# Patient Record
Sex: Male | Born: 1985 | Hispanic: Yes | Marital: Single | Smoking: Never smoker
Health system: Southern US, Community
[De-identification: ages and names within clinical notes are randomized; demographics above are authoritative.]

## PROBLEM LIST (undated history)

## (undated) HISTORY — PX: APPENDECTOMY: SHX54

## (undated) HISTORY — PX: ELBOW FRACTURE SURGERY: SHX616

---

## 2013-06-03 ENCOUNTER — Emergency Department (HOSPITAL_COMMUNITY): Payer: Worker's Compensation

## 2013-06-03 ENCOUNTER — Encounter (HOSPITAL_BASED_OUTPATIENT_CLINIC_OR_DEPARTMENT_OTHER)
Admission: RE | Disposition: A | Discharge status: Home / Self Care | Payer: Self-pay | Source: Ambulatory Visit | Attending: Orthopedic Surgery

## 2013-06-03 ENCOUNTER — Emergency Department (HOSPITAL_COMMUNITY)
Admission: EM | Admit: 2013-06-03 | Discharge: 2013-06-03 | Disposition: A | Discharge status: Home / Self Care | Payer: Worker's Compensation | Source: Home / Self Care | Attending: Emergency Medicine | Admitting: Emergency Medicine

## 2013-06-03 ENCOUNTER — Ambulatory Visit (HOSPITAL_BASED_OUTPATIENT_CLINIC_OR_DEPARTMENT_OTHER): Payer: Worker's Compensation | Admitting: Anesthesiology

## 2013-06-03 ENCOUNTER — Encounter (HOSPITAL_BASED_OUTPATIENT_CLINIC_OR_DEPARTMENT_OTHER): Payer: Self-pay | Admitting: *Deleted

## 2013-06-03 ENCOUNTER — Ambulatory Visit (HOSPITAL_BASED_OUTPATIENT_CLINIC_OR_DEPARTMENT_OTHER)
Admission: RE | Admit: 2013-06-03 | Discharge: 2013-06-03 | Disposition: A | Discharge status: Home / Self Care | Payer: Worker's Compensation | Source: Ambulatory Visit | Attending: Orthopedic Surgery | Admitting: Orthopedic Surgery

## 2013-06-03 ENCOUNTER — Encounter (HOSPITAL_BASED_OUTPATIENT_CLINIC_OR_DEPARTMENT_OTHER): Payer: Worker's Compensation | Admitting: Anesthesiology

## 2013-06-03 ENCOUNTER — Encounter (HOSPITAL_COMMUNITY): Payer: Self-pay | Admitting: Emergency Medicine

## 2013-06-03 DIAGNOSIS — Z9089 Acquired absence of other organs: Secondary | ICD-10-CM | POA: Insufficient documentation

## 2013-06-03 DIAGNOSIS — S62523A Displaced fracture of distal phalanx of unspecified thumb, initial encounter for closed fracture: Secondary | ICD-10-CM

## 2013-06-03 DIAGNOSIS — S62522B Displaced fracture of distal phalanx of left thumb, initial encounter for open fracture: Secondary | ICD-10-CM

## 2013-06-03 DIAGNOSIS — W19XXXA Unspecified fall, initial encounter: Secondary | ICD-10-CM | POA: Insufficient documentation

## 2013-06-03 DIAGNOSIS — S62639B Displaced fracture of distal phalanx of unspecified finger, initial encounter for open fracture: Secondary | ICD-10-CM | POA: Insufficient documentation

## 2013-06-03 HISTORY — PX: I & D EXTREMITY: SHX5045

## 2013-06-03 SURGERY — IRRIGATION AND DEBRIDEMENT EXTREMITY
Anesthesia: General | Site: Thumb | Laterality: Left

## 2013-06-03 MED ORDER — FENTANYL CITRATE 0.05 MG/ML IJ SOLN
INTRAMUSCULAR | Status: AC
  Filled 2013-06-03: qty 6

## 2013-06-03 MED ORDER — TETANUS-DIPHTH-ACELL PERTUSSIS 5-2.5-18.5 LF-MCG/0.5 IM SUSP
0.5000 mL | Freq: Once | INTRAMUSCULAR | Status: AC
  Administered 2013-06-03: 0.5 mL via INTRAMUSCULAR
  Filled 2013-06-03: qty 0.5

## 2013-06-03 MED ORDER — KETOROLAC TROMETHAMINE 30 MG/ML IJ SOLN
INTRAMUSCULAR | Status: DC | PRN
  Administered 2013-06-03: 30 mg via INTRAVENOUS

## 2013-06-03 MED ORDER — MIDAZOLAM HCL 2 MG/2ML IJ SOLN
INTRAMUSCULAR | Status: AC
  Filled 2013-06-03: qty 2

## 2013-06-03 MED ORDER — BUPIVACAINE HCL (PF) 0.5 % IJ SOLN
INTRAMUSCULAR | Status: AC
  Filled 2013-06-03: qty 30

## 2013-06-03 MED ORDER — LIDOCAINE HCL (CARDIAC) 20 MG/ML IV SOLN
INTRAVENOUS | Status: DC | PRN
  Administered 2013-06-03: 60 mg via INTRAVENOUS

## 2013-06-03 MED ORDER — LACTATED RINGERS IV SOLN
INTRAVENOUS | Status: DC
  Administered 2013-06-03: 16:00:00 via INTRAVENOUS

## 2013-06-03 MED ORDER — MORPHINE SULFATE 4 MG/ML IJ SOLN
4.0000 mg | Freq: Once | INTRAMUSCULAR | Status: AC
  Administered 2013-06-03: 4 mg via INTRAMUSCULAR
  Filled 2013-06-03: qty 1

## 2013-06-03 MED ORDER — ONDANSETRON HCL 4 MG/2ML IJ SOLN
INTRAMUSCULAR | Status: DC | PRN
  Administered 2013-06-03: 4 mg via INTRAVENOUS

## 2013-06-03 MED ORDER — CEFAZOLIN SODIUM-DEXTROSE 2-3 GM-% IV SOLR
2.0000 g | Freq: Once | INTRAVENOUS | Status: AC
  Administered 2013-06-03: 2 g via INTRAVENOUS

## 2013-06-03 MED ORDER — FENTANYL CITRATE 0.05 MG/ML IJ SOLN
25.0000 ug | INTRAMUSCULAR | Status: DC | PRN
  Administered 2013-06-03: 50 ug via INTRAVENOUS

## 2013-06-03 MED ORDER — OXYCODONE-ACETAMINOPHEN 5-325 MG PO TABS: 1.0000 | ORAL_TABLET | ORAL | Status: AC | PRN

## 2013-06-03 MED ORDER — DEXAMETHASONE SODIUM PHOSPHATE 4 MG/ML IJ SOLN
INTRAMUSCULAR | Status: DC | PRN
  Administered 2013-06-03: 10 mg via INTRAVENOUS

## 2013-06-03 MED ORDER — MIDAZOLAM HCL 2 MG/2ML IJ SOLN: 1.0000 mg | INTRAMUSCULAR | Status: DC | PRN

## 2013-06-03 MED ORDER — OXYCODONE HCL 5 MG PO TABS
5.0000 mg | ORAL_TABLET | Freq: Once | ORAL | Status: AC | PRN
  Administered 2013-06-03: 5 mg via ORAL
  Filled 2013-06-03: qty 1

## 2013-06-03 MED ORDER — ONDANSETRON HCL 4 MG/2ML IJ SOLN: 4.0000 mg | Freq: Four times a day (QID) | INTRAMUSCULAR | Status: DC | PRN

## 2013-06-03 MED ORDER — BUPIVACAINE HCL (PF) 0.25 % IJ SOLN
INTRAMUSCULAR | Status: AC
Start: 2013-06-03 — End: 2013-06-03
  Filled 2013-06-03: qty 30

## 2013-06-03 MED ORDER — OXYCODONE HCL 5 MG/5ML PO SOLN: 5.0000 mg | Freq: Once | ORAL | Status: AC | PRN

## 2013-06-03 MED ORDER — FENTANYL CITRATE 0.05 MG/ML IJ SOLN
INTRAMUSCULAR | Status: AC
  Filled 2013-06-03: qty 2

## 2013-06-03 MED ORDER — FENTANYL CITRATE 0.05 MG/ML IJ SOLN: 50.0000 ug | INTRAMUSCULAR | Status: DC | PRN

## 2013-06-03 MED ORDER — FENTANYL CITRATE 0.05 MG/ML IJ SOLN
INTRAMUSCULAR | Status: DC | PRN
  Administered 2013-06-03: 100 ug via INTRAVENOUS

## 2013-06-03 MED ORDER — PROPOFOL 10 MG/ML IV BOLUS
INTRAVENOUS | Status: DC | PRN
  Administered 2013-06-03: 300 mg via INTRAVENOUS

## 2013-06-03 MED ORDER — CEFAZOLIN SODIUM-DEXTROSE 2-3 GM-% IV SOLR
INTRAVENOUS | Status: AC
  Filled 2013-06-03: qty 50

## 2013-06-03 MED ORDER — MIDAZOLAM HCL 5 MG/5ML IJ SOLN
INTRAMUSCULAR | Status: DC | PRN
  Administered 2013-06-03: 2 mg via INTRAVENOUS

## 2013-06-03 MED ORDER — BUPIVACAINE HCL (PF) 0.25 % IJ SOLN
INTRAMUSCULAR | Status: DC | PRN
  Administered 2013-06-03: 7 mL

## 2013-06-03 MED ORDER — LIDOCAINE HCL (PF) 1 % IJ SOLN
INTRAMUSCULAR | Status: AC
  Filled 2013-06-03: qty 30

## 2013-06-03 SURGICAL SUPPLY — 69 items
APL SKNCLS STERI-STRIP NONHPOA (GAUZE/BANDAGES/DRESSINGS)
BAG DECANTER FOR FLEXI CONT (MISCELLANEOUS) IMPLANT
BANDAGE ELASTIC 3 VELCRO ST LF (GAUZE/BANDAGES/DRESSINGS) IMPLANT
BANDAGE ELASTIC 4 VELCRO ST LF (GAUZE/BANDAGES/DRESSINGS) IMPLANT
BENZOIN TINCTURE PRP APPL 2/3 (GAUZE/BANDAGES/DRESSINGS) IMPLANT
BLADE SURG 15 STRL LF DISP TIS (BLADE) ×1 IMPLANT
BLADE SURG 15 STRL SS (BLADE) ×3
BNDG CMPR 9X4 STRL LF SNTH (GAUZE/BANDAGES/DRESSINGS)
BNDG COHESIVE 1X5 TAN STRL LF (GAUZE/BANDAGES/DRESSINGS) ×3 IMPLANT
BNDG ESMARK 4X9 LF (GAUZE/BANDAGES/DRESSINGS) IMPLANT
BNDG GAUZE ELAST 4 BULKY (GAUZE/BANDAGES/DRESSINGS) IMPLANT
CLOSURE WOUND 1/2 X4 (GAUZE/BANDAGES/DRESSINGS)
CORDS BIPOLAR (ELECTRODE) ×3 IMPLANT
COVER TABLE BACK 60X90 (DRAPES) ×3 IMPLANT
CUFF TOURNIQUET SINGLE 18IN (TOURNIQUET CUFF) ×3 IMPLANT
DECANTER SPIKE VIAL GLASS SM (MISCELLANEOUS) IMPLANT
DRAPE EXTREMITY T 121X128X90 (DRAPE) ×3 IMPLANT
DRAPE SURG 17X23 STRL (DRAPES) ×3 IMPLANT
DURAPREP 26ML APPLICATOR (WOUND CARE) ×3 IMPLANT
GAUZE PACKING IODOFORM 1/4X15 (GAUZE/BANDAGES/DRESSINGS) IMPLANT
GAUZE XEROFORM 1X8 LF (GAUZE/BANDAGES/DRESSINGS) IMPLANT
GLOVE BIOGEL M STRL SZ7.5 (GLOVE) ×3 IMPLANT
GLOVE BIOGEL PI IND STRL 8 (GLOVE) ×1 IMPLANT
GLOVE BIOGEL PI INDICATOR 8 (GLOVE) ×2
GLOVE SURG SYN 8.0 (GLOVE) ×6 IMPLANT
GOWN STRL REUS W/ TWL LRG LVL3 (GOWN DISPOSABLE) ×1 IMPLANT
GOWN STRL REUS W/TWL LRG LVL3 (GOWN DISPOSABLE) ×3
GOWN STRL REUS W/TWL XL LVL3 (GOWN DISPOSABLE) ×3 IMPLANT
HANDPIECE INTERPULSE COAX TIP (DISPOSABLE)
IV NS IRRIG 3000ML ARTHROMATIC (IV SOLUTION) IMPLANT
K-WIRE .035X4 (WIRE) ×3 IMPLANT
LOOP VESSEL MAXI BLUE (MISCELLANEOUS) IMPLANT
NEEDLE 27GAX1X1/2 (NEEDLE) IMPLANT
NEEDLE HYPO 25X1 1.5 SAFETY (NEEDLE) ×3 IMPLANT
NS IRRIG 1000ML POUR BTL (IV SOLUTION) IMPLANT
PACK BASIN DAY SURGERY FS (CUSTOM PROCEDURE TRAY) ×3 IMPLANT
PAD CAST 3X4 CTTN HI CHSV (CAST SUPPLIES) IMPLANT
PADDING CAST ABS 3INX4YD NS (CAST SUPPLIES)
PADDING CAST ABS 4INX4YD NS (CAST SUPPLIES)
PADDING CAST ABS COTTON 3X4 (CAST SUPPLIES) IMPLANT
PADDING CAST ABS COTTON 4X4 ST (CAST SUPPLIES) IMPLANT
PADDING CAST COTTON 3X4 STRL (CAST SUPPLIES)
SET HNDPC FAN SPRY TIP SCT (DISPOSABLE) IMPLANT
SHEET MEDIUM DRAPE 40X70 STRL (DRAPES) ×3 IMPLANT
SPLINT FINGER 5/8X2.25 (CAST SUPPLIES) ×1 IMPLANT
SPLINT PLASTALUME 2 1/4 (CAST SUPPLIES) ×3
SPLINT PLASTER CAST XFAST 3X15 (CAST SUPPLIES) IMPLANT
SPLINT PLASTER CAST XFAST 4X15 (CAST SUPPLIES) IMPLANT
SPLINT PLASTER XTRA FAST SET 4 (CAST SUPPLIES)
SPLINT PLASTER XTRA FASTSET 3X (CAST SUPPLIES)
SPONGE GAUZE 4X4 12PLY (GAUZE/BANDAGES/DRESSINGS) ×3 IMPLANT
STOCKINETTE 4X48 STRL (DRAPES) ×3 IMPLANT
STRIP CLOSURE SKIN 1/2X4 (GAUZE/BANDAGES/DRESSINGS) IMPLANT
SUT ETHILON 3 0 PS 1 (SUTURE) IMPLANT
SUT ETHILON 5 0 PS 2 18 (SUTURE) IMPLANT
SUT PROLENE 3 0 PS 2 (SUTURE) IMPLANT
SUT VIC AB 4-0 P-3 18XBRD (SUTURE) IMPLANT
SUT VIC AB 4-0 P3 18 (SUTURE)
SUT VICRYL RAPIDE 4/0 PS 2 (SUTURE) IMPLANT
SWAB COLLECTION DEVICE MRSA (MISCELLANEOUS) IMPLANT
SYR BULB 3OZ (MISCELLANEOUS) ×3 IMPLANT
SYR CONTROL 10ML LL (SYRINGE) ×3 IMPLANT
SYRINGE 10CC LL (SYRINGE) ×3 IMPLANT
TOWEL OR 17X24 6PK STRL BLUE (TOWEL DISPOSABLE) ×3 IMPLANT
TUBE ANAEROBIC SPECIMEN COL (MISCELLANEOUS) IMPLANT
TUBE CONNECTING 20'X1/4 (TUBING)
TUBE CONNECTING 20X1/4 (TUBING) IMPLANT
UNDERPAD 30X30 INCONTINENT (UNDERPADS AND DIAPERS) ×3 IMPLANT
YANKAUER SUCT BULB TIP NO VENT (SUCTIONS) IMPLANT

## 2013-06-03 NOTE — ED Provider Notes (Signed)
CSN: [id number]     Arrival date & time 06/03/13  0941 History   First MD Initiated Contact with Patient 06/03/13 1000     Chief Complaint  Patient presents with  . Laceration   (Consider location/radiation/quality/duration/timing/severity/associated sxs/prior Treatment) The history is provided by the patient and medical records.   This is a 28 y.o. M with no significant PMH presenting to the ED for thumb laceration.  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  While trying to catch himself, he cut his left thumb on the shears.  Pt has deep laceration to distal left thumb, bleeding well controlled on arrival.  No head trauma, LOC, or other injuries reported.  Pt is right hand dominant.  Date of last tetanus unknown.  Last PO intake 0700.  History reviewed. No pertinent past medical history. Past Surgical History  Procedure Laterality Date  . Elbow fracture surgery Right   . Appendectomy     No family history on file. History  Substance Use Topics  . Smoking status: Never Smoker   . Smokeless tobacco: Not on file  . Alcohol Use: Yes     Comment: social    Review of Systems  Skin: Positive for wound.  All other systems reviewed and are negative.    Allergies  Review of patient's allergies indicates no known allergies.  Home Medications  No current outpatient prescriptions on file. BP 125/87  Pulse 84  Temp(Src) 97.6 F (36.4 C) (Oral)  Resp 18  SpO2 99%  Physical Exam  Nursing note and vitals reviewed. Constitutional: He is oriented to person, place, and time. He appears well-developed and well-nourished. No distress.  HENT:  Head: Normocephalic and atraumatic.  Mouth/Throat: Oropharynx is clear and moist.  Eyes: Conjunctivae and EOM are normal. Pupils are equal, round, and reactive to light.  Neck: Normal range of motion. Neck supple.  Cardiovascular: Normal rate, regular rhythm and normal heart sounds.     Pulmonary/Chest: Effort normal and breath sounds normal. No respiratory distress. He has no wheezes.  Musculoskeletal: Normal range of motion.       Hands: Deep laceration to left thumb just distal to IP joint spanning across volar and dorsal surface, somewhat macerated in appearance; full flexion and extension of thumb maintained; sensation intact diffusely throughout digit; normal cap refill  Neurological: He is alert and oriented to person, place, and time.  Skin: Skin is warm and dry. He is not diaphoretic.  Psychiatric: He has a normal mood and affect.       ED Course  Procedures (including critical care time) Labs Review Labs Reviewed - No data to display Imaging Review Dg Finger Thumb Left  06/03/2013   CLINICAL DATA:  Status post fall.  Laceration left thumb.  EXAM: LEFT THUMB 2+V  COMPARISON:  None.  FINDINGS: There is a comminuted fracture of the distal phalanx of the thumb with an associated laceration. The fracture is mildly distracted just proximal to the tuft but not notably displaced. No radiopaque foreign body is identified.  IMPRESSION: Comminuted, open fracture distal phalanx left thumb.   Electronically Signed   By: Drusilla Kanner M.D.   On: 06/03/2013 10:56    EKG Interpretation   None       MDM   1. Open fracture of distal phalanx of left thumb    Comminuted, open fx of left distal thumb.  Tetanus updated.  Consulted hand surgery, Dr. Mina Marble-- [facility] to  [facility] for formal repair in OR.  Pt will go to [facility] by POV-- instructed to remain NPO.  Discussed plan with pt and family in room, both acknowledged understanding and agreed.  Garlon HatchetLisa M Ayslin Kundert, PA-C 06/03/13 [id number]

## 2013-06-03 NOTE — H&P (Signed)
Patrick Conley is an 28 y.o. male.   Chief Complaint: left thumb pain HPI: as above s/p complex laceration to left thumb at work  No past medical history on file.  Past Surgical History  Procedure Laterality Date  . Elbow fracture surgery Right   . Appendectomy      History reviewed. No pertinent family history. Social History:  reports that he has never smoked. He does not have any smokeless tobacco history on file. He reports that he drinks alcohol. He reports that he does not use illicit drugs.  Allergies: No Known Allergies  No prescriptions prior to admission    No results found for this or any previous visit (from the past 48 hour(s)). Dg Finger Thumb Left  06/03/2013   CLINICAL DATA:  Status post fall.  Laceration left thumb.  EXAM: LEFT THUMB 2+V  COMPARISON:  None.  FINDINGS: There is a comminuted fracture of the distal phalanx of the thumb with an associated laceration. The fracture is mildly distracted just proximal to the tuft but not notably displaced. No radiopaque foreign body is identified.  IMPRESSION: Comminuted, open fracture distal phalanx left thumb.   Electronically Signed   By: Drusilla Kannerhomas  Dalessio M.D.   On: 06/03/2013 10:56    Review of Systems  All other systems reviewed and are negative.    Blood pressure 118/56, pulse 72, temperature 98.2 F (36.8 C), temperature source Oral, resp. rate 16, height 5\' 10"  (1.778 m), weight 90.719 kg (200 lb), SpO2 98.00%. Physical Exam  Constitutional: He is oriented to person, place, and time. He appears well-developed and well-nourished.  HENT:  Head: Normocephalic and atraumatic.  Cardiovascular: Normal rate.   Respiratory: Effort normal.  Musculoskeletal:       Left hand: He exhibits disruption of two-point discrimination, deformity and laceration.  Complex left thumb distal laceration with distal phalanx fracture  Neurological: He is alert and oriented to person, place, and time.  Skin: Skin is warm.  Psychiatric:  He has a normal mood and affect. His behavior is normal. Judgment and thought content normal.     Assessment/Plan As above   Plan explore and repair as needed  Noralee Dutko A 06/03/2013, 3:30 PM

## 2013-06-03 NOTE — Anesthesia Procedure Notes (Signed)
Procedure Name: LMA Insertion Performed by: York GricePEARSON, Joellyn Grandt W Pre-anesthesia Checklist: Patient identified, Timeout performed, Emergency Drugs available, Suction available and Patient being monitored Patient Re-evaluated:Patient Re-evaluated prior to inductionOxygen Delivery Method: Circle system utilized Preoxygenation: Pre-oxygenation with 100% oxygen Intubation Type: IV induction Ventilation: Mask ventilation without difficulty LMA: LMA inserted LMA Size: 4.0 Number of attempts: 1 Placement Confirmation: breath sounds checked- equal and bilateral and positive ETCO2 Dental Injury: Teeth and Oropharynx as per pre-operative assessment

## 2013-06-03 NOTE — ED Notes (Addendum)
Pt was holding pruning shears while landscaping this am, fell cutting his L thumb on shears. Pt has deep, lac to L thumb, bleeding controlled. Pt reports no feeling from lac to end of thumb. Pt has good color in fingernail.  Pt denies other injury from fall. Pt is A&O and in NAD

## 2013-06-03 NOTE — Discharge Instructions (Signed)

## 2013-06-03 NOTE — Transfer of Care (Signed)
Immediate Anesthesia Transfer of Care Note  Patient: Patrick Conley  Procedure(s) Performed: Procedure(s): IRRIGATION AND DEBRIDEMENT LEFT THUMB WITH PINNING (Left)  Patient Location: PACU  Anesthesia Type:General  Level of Consciousness: awake and patient cooperative  Airway & Oxygen Therapy: Patient Spontanous Breathing and Patient connected to face mask oxygen  Post-op Assessment: Report given to PACU RN and Post -op Vital signs reviewed and stable  Post vital signs: Reviewed and stable  Complications: No apparent anesthesia complications

## 2013-06-03 NOTE — Discharge Instructions (Signed)
Go directly to [facility]--- go to front desk and they will direct you. DO NOT EAT OR DRINK ANYTHING PRIOR TO ARRIVAL IN THE HOSPITAL! Call [facility] if you have any trouble on your way there-- [phone]

## 2013-06-03 NOTE — Anesthesia Postprocedure Evaluation (Signed)
  Anesthesia Post-op Note  Patient: Patrick Conley  Procedure(s) Performed: Procedure(s): IRRIGATION AND DEBRIDEMENT LEFT THUMB WITH PINNING (Left)  Patient Location: PACU  Anesthesia Type:General  Level of Consciousness: awake, alert  and oriented  Airway and Oxygen Therapy: Patient Spontanous Breathing  Post-op Pain: moderate  Post-op Assessment: Post-op Vital signs reviewed  Post-op Vital Signs: Reviewed  Complications: No apparent anesthesia complications

## 2013-06-03 NOTE — Op Note (Signed)
See note [phone]

## 2013-06-03 NOTE — Anesthesia Preprocedure Evaluation (Addendum)
Anesthesia Evaluation  Patient identified by MRN, date of birth, ID band Patient awake    Reviewed: Allergy & Precautions, H&P , NPO status , Patient's Chart, lab work & pertinent test results  Airway Mallampati: II  Neck ROM: full    Dental   Pulmonary neg pulmonary ROS,          Cardiovascular negative cardio ROS      Neuro/Psych    GI/Hepatic   Endo/Other    Renal/GU      Musculoskeletal   Abdominal   Peds  Hematology   Anesthesia Other Findings   Reproductive/Obstetrics                           Anesthesia Physical Anesthesia Plan  ASA: I  Anesthesia Plan: General   Post-op Pain Management:    Induction: Intravenous  Airway Management Planned: LMA  Additional Equipment:   Intra-op Plan:   Post-operative Plan: Extubation in OR  Informed Consent: I have reviewed the patients History and Physical, chart, labs and discussed the procedure including the risks, benefits and alternatives for the proposed anesthesia with the patient or authorized representative who has indicated his/her understanding and acceptance.     Plan Discussed with: CRNA, Anesthesiologist and Surgeon  Anesthesia Plan Comments:        Anesthesia Quick Evaluation

## 2013-06-04 NOTE — Op Note (Signed)
NAMJeanella Conley:  Patrick Conley, Patrick Conley                ACCOUNT NO.:  [id number]  MEDICAL RECORD NO.:  [id number]  LOCATION:  [facility]OT[facility]                         [facility]  PHYSICIAN:  Artist PaisMatthew A. Darin Redmann, M.D.DATE OF BIRTH:  [birth year]  DATE OF PROCEDURE:  06/03/2013 DATE OF DISCHARGE:  06/03/2013                              OPERATIVE REPORT   PREOPERATIVE DIAGNOSIS:  Open grade 2 distal phalangeal fracture, left thumb.  POSTOPERATIVE DIAGNOSIS:  Open grade 2 distal phalangeal fracture, left thumb.  PROCEDURE:  Incision and drainage above irrigation, incision and drainage with open reduction and internal fixation __________ K-wires.  SURGEON:  Artist PaisMatthew A. Mina MarbleWeingold, M.D.  ASSISTANT:  None.  ANESTHESIA:  General.  COMPLICATION:  None.  DRAINS:  None.  DESCRIPTION OF THE PROCEDURE:  The patient was taken to the operating suite.  After induction of adequate general anesthesia, the left upper extremity was prepped and draped in sterile fashion.  Esmarch was used to exsanguinate the limb.  Tourniquet was inflated to 250 mmHg.  At this point in time, the left thumb was approached.  There was a complex laceration along the radial border from the IP joint distally.  FPL was intact.  There was an open fracture at the midshaft of the distal phalanx.  There was segmental injury in at least 3 places to the neurovascular bundle, which was irreparable.  The wound was thoroughly irrigated.  Clot was removed.  Reduction was performed with close traction and pressure.  Under direct fluoroscopic guidance, __________ K- wires were driven from distal to proximal across the fracture site, with care not to pass across the interphalangeal joint.  Two of these were used to cut outside __________ themselves.  The wound was then closed with complex 4-0 Vicryl Rapide and multiple sutures.  Xeroform, 4 x 4s, fluffs, and a volar splint was applied with a Coban wrap.  The patient tolerated the procedure well  __________.     Artist PaisMatthew A. Mina MarbleWeingold, M.D.    MAW/MEDQ  D:  06/03/2013  T:  06/04/2013  Job:  [id number]

## 2013-06-04 NOTE — Op Note (Deleted)
NAME:  Patrick Conley, Patrick Conley                ACCOUNT NO.:  [id number]  MEDICAL RECORD NO.:  [id number]  LOCATION:  WOTF                         FACILITY:  [facility]  PHYSICIAN:  Tawni Melkonian A. Mikle Sternberg, M.D.DATE OF BIRTH:  03/24/1986  DATE OF PROCEDURE:  06/03/2013 DATE OF DISCHARGE:  06/03/2013                              OPERATIVE REPORT   PREOPERATIVE DIAGNOSIS:  Open grade 2 distal phalangeal fracture, left thumb.  POSTOPERATIVE DIAGNOSIS:  Open grade 2 distal phalangeal fracture, left thumb.  PROCEDURE:  Incision and drainage above irrigation, incision and drainage with open reduction and internal fixation __________ K-wires.  SURGEON:  Ricco Dershem A. Urania Pearlman, M.D.  ASSISTANT:  None.  ANESTHESIA:  General.  COMPLICATION:  None.  DRAINS:  None.  DESCRIPTION OF THE PROCEDURE:  The patient was taken to the operating suite.  After induction of adequate general anesthesia, the left upper extremity was prepped and draped in sterile fashion.  Esmarch was used to exsanguinate the limb.  Tourniquet was inflated to 250 mmHg.  At this point in time, the left thumb was approached.  There was a complex laceration along the radial border from the IP joint distally.  FPL was intact.  There was an open fracture at the midshaft of the distal phalanx.  There was segmental injury in at least 3 places to the neurovascular bundle, which was irreparable.  The wound was thoroughly irrigated.  Clot was removed.  Reduction was performed with close traction and pressure.  Under direct fluoroscopic guidance, __________ K- wires were driven from distal to proximal across the fracture site, with care not to pass across the interphalangeal joint.  Two of these were used to cut outside __________ themselves.  The wound was then closed with complex 4-0 Vicryl Rapide and multiple sutures.  Xeroform, 4 x 4s, fluffs, and a volar splint was applied with a Coban wrap.  The patient tolerated the procedure well  __________.     Athenia Rys A. Katrenia Alkins, M.D.    MAW/MEDQ  D:  06/03/2013  T:  06/04/2013  Job:  [id number] 

## 2013-06-05 NOTE — ED Provider Notes (Signed)
Medical screening examination/treatment/procedure(s) were conducted as a shared visit with non-physician practitioner(s) and myself.  I personally evaluated the patient during the encounter.  Pt with complicated lacerated thumb injury with open fracture.  Hand surgery consulted.  Pt will be evaluated by Dr Mina MarbleWeingold.  Patrick KrasJon R Marielouise Amey, MD 06/05/13 [phone]

## 2013-06-06 ENCOUNTER — Encounter (HOSPITAL_BASED_OUTPATIENT_CLINIC_OR_DEPARTMENT_OTHER): Payer: Self-pay | Admitting: Orthopedic Surgery

## 2013-06-06 LAB — POCT HEMOGLOBIN-HEMACUE: Hemoglobin: 17 g/dL (ref 13.0–17.0)

## 2015-05-12 IMAGING — CR DG FINGER THUMB 2+V*L*
3 series · 3 of 3 positions shown · non-contrast
Comparison: None.

CLINICAL DATA: Status post fall.  Laceration left thumb.

EXAM:
LEFT THUMB 2+V

[x finger pa left]
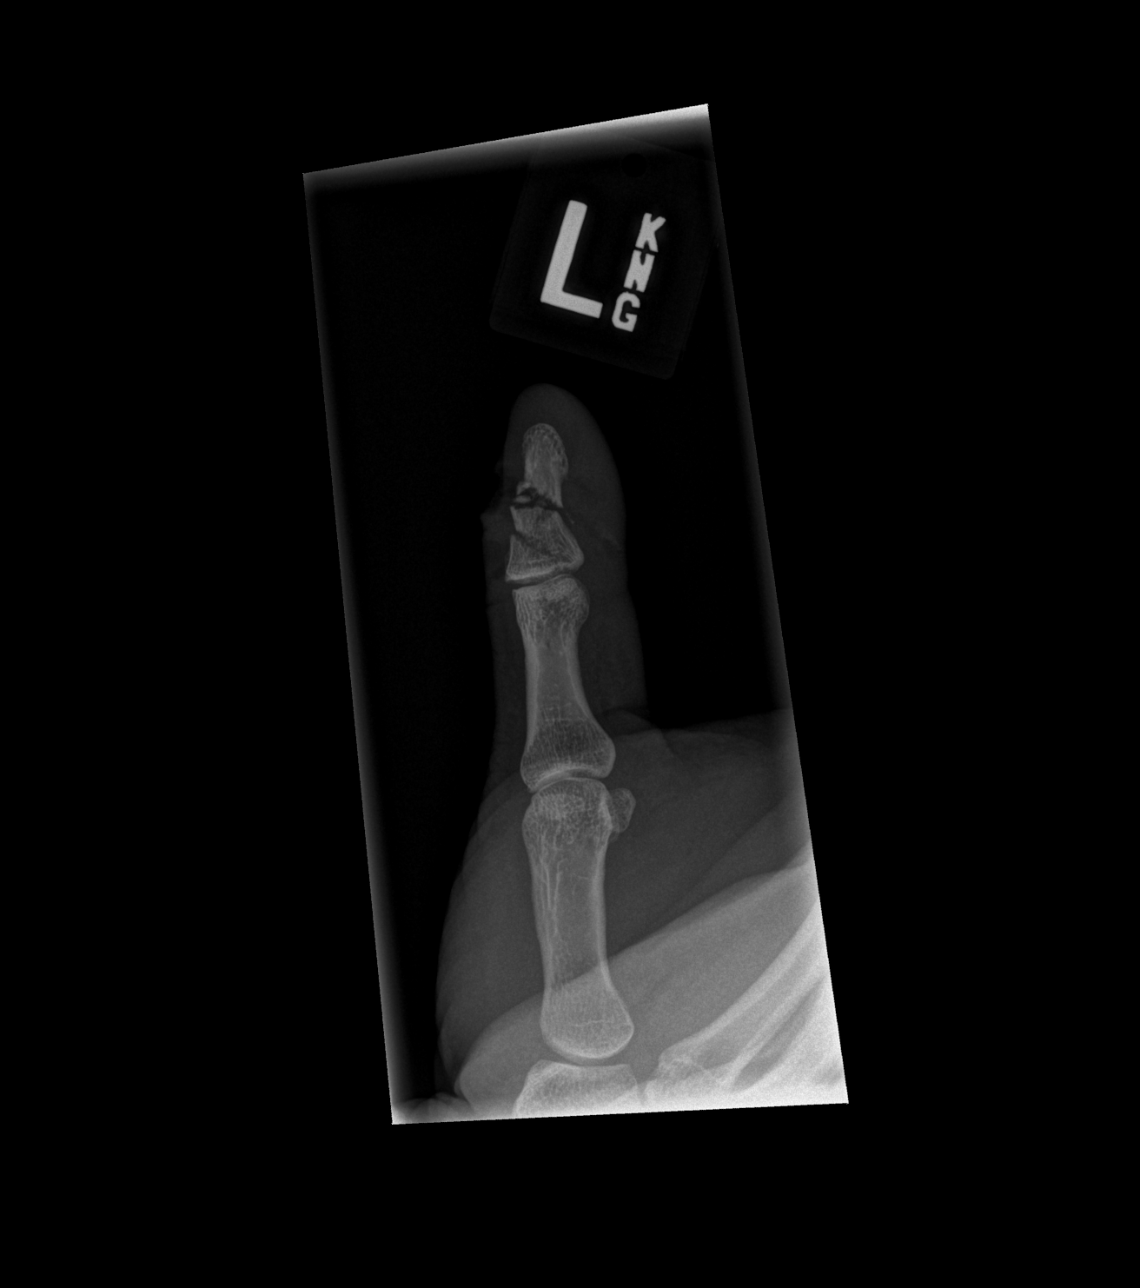

[x finger obl left]
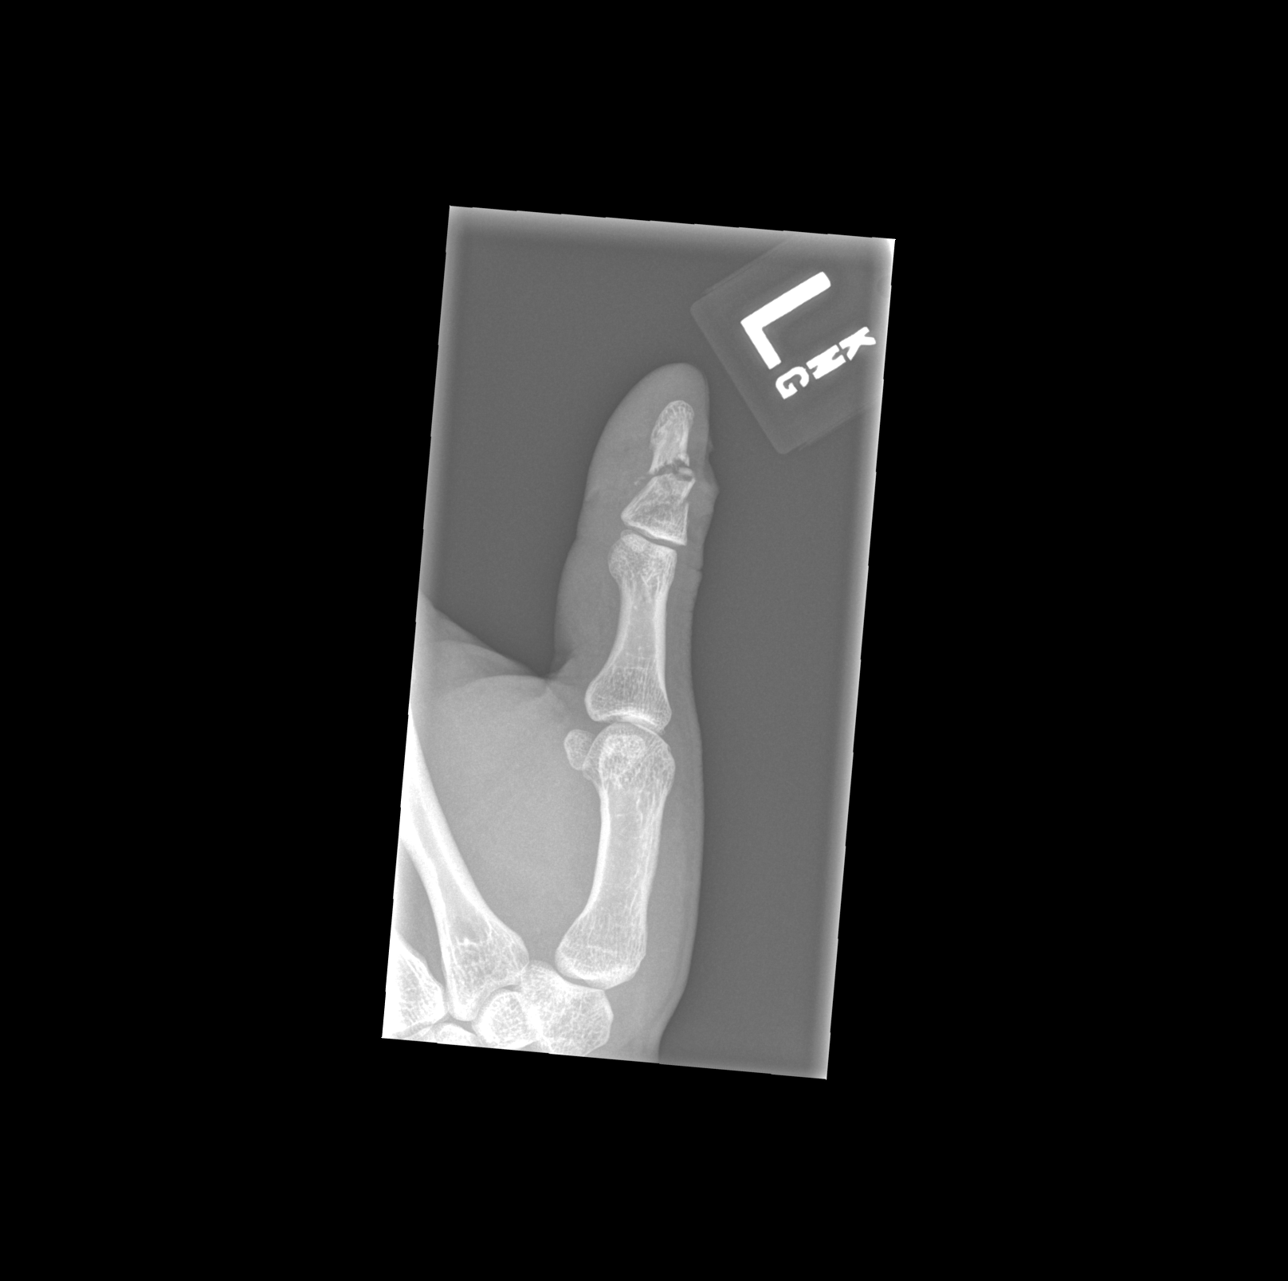

[x finger lat left]
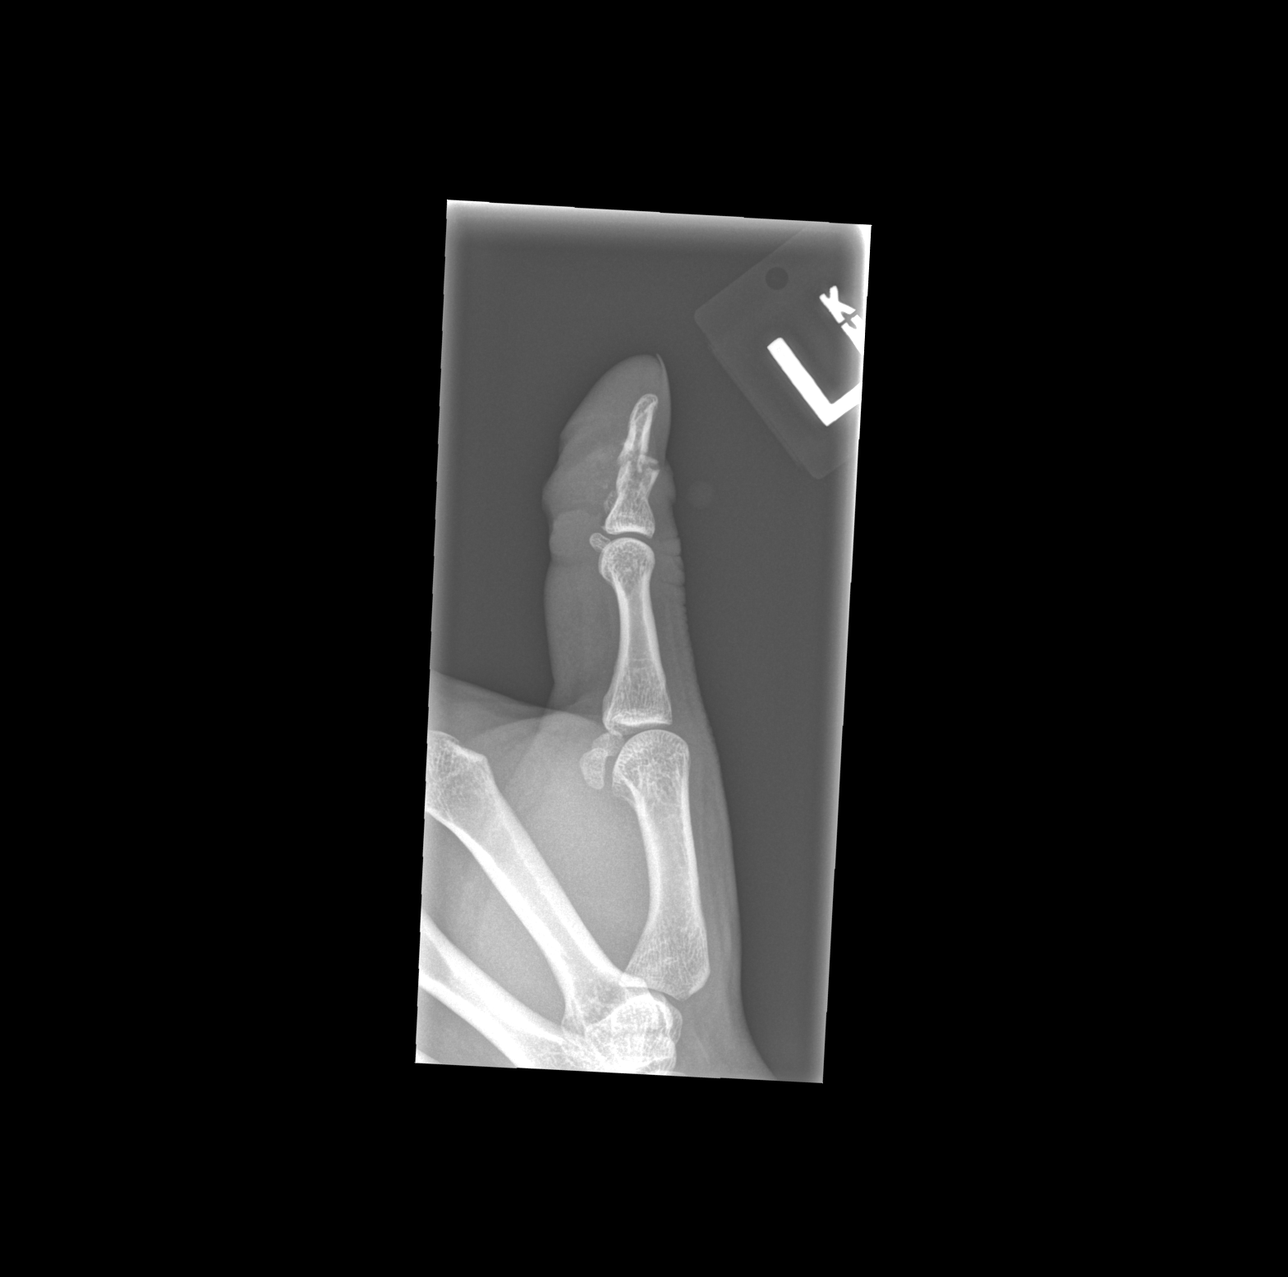

[3 of 3 positions shown; findings below may reference images not displayed]

FINDINGS: There is a comminuted fracture of the distal phalanx of the thumb
with an associated laceration. The fracture is mildly distracted
just proximal to the tuft but not notably displaced. No radiopaque
foreign body is identified.
IMPRESSION: Comminuted, open fracture distal phalanx left thumb.
